# Patient Record
Sex: Male | Born: 1980 | Race: White | Hispanic: No | Marital: Single | Smoking: Never smoker
Health system: Southern US, Community
[De-identification: ages and names within clinical notes are randomized; demographics above are authoritative.]

## PROBLEM LIST (undated history)

## (undated) HISTORY — PX: DG SHOULDER ARTHROGRAM LEFT: HXRAD1607

---

## 2007-06-16 ENCOUNTER — Emergency Department (HOSPITAL_COMMUNITY): Admission: EM | Admit: 2007-06-16 | Discharge: 2007-06-16 | Payer: Self-pay | Admitting: Emergency Medicine

## 2009-01-13 IMAGING — CR DG HAND COMPLETE 3+V*R*
3 series · 3 of 3 positions shown · non-contrast
Comparison: None.

CLINICAL DATA: Injured right hand while punching a door.

RIGHT HAND - 3 VIEW  06/16/2007:

[x hand ap right]
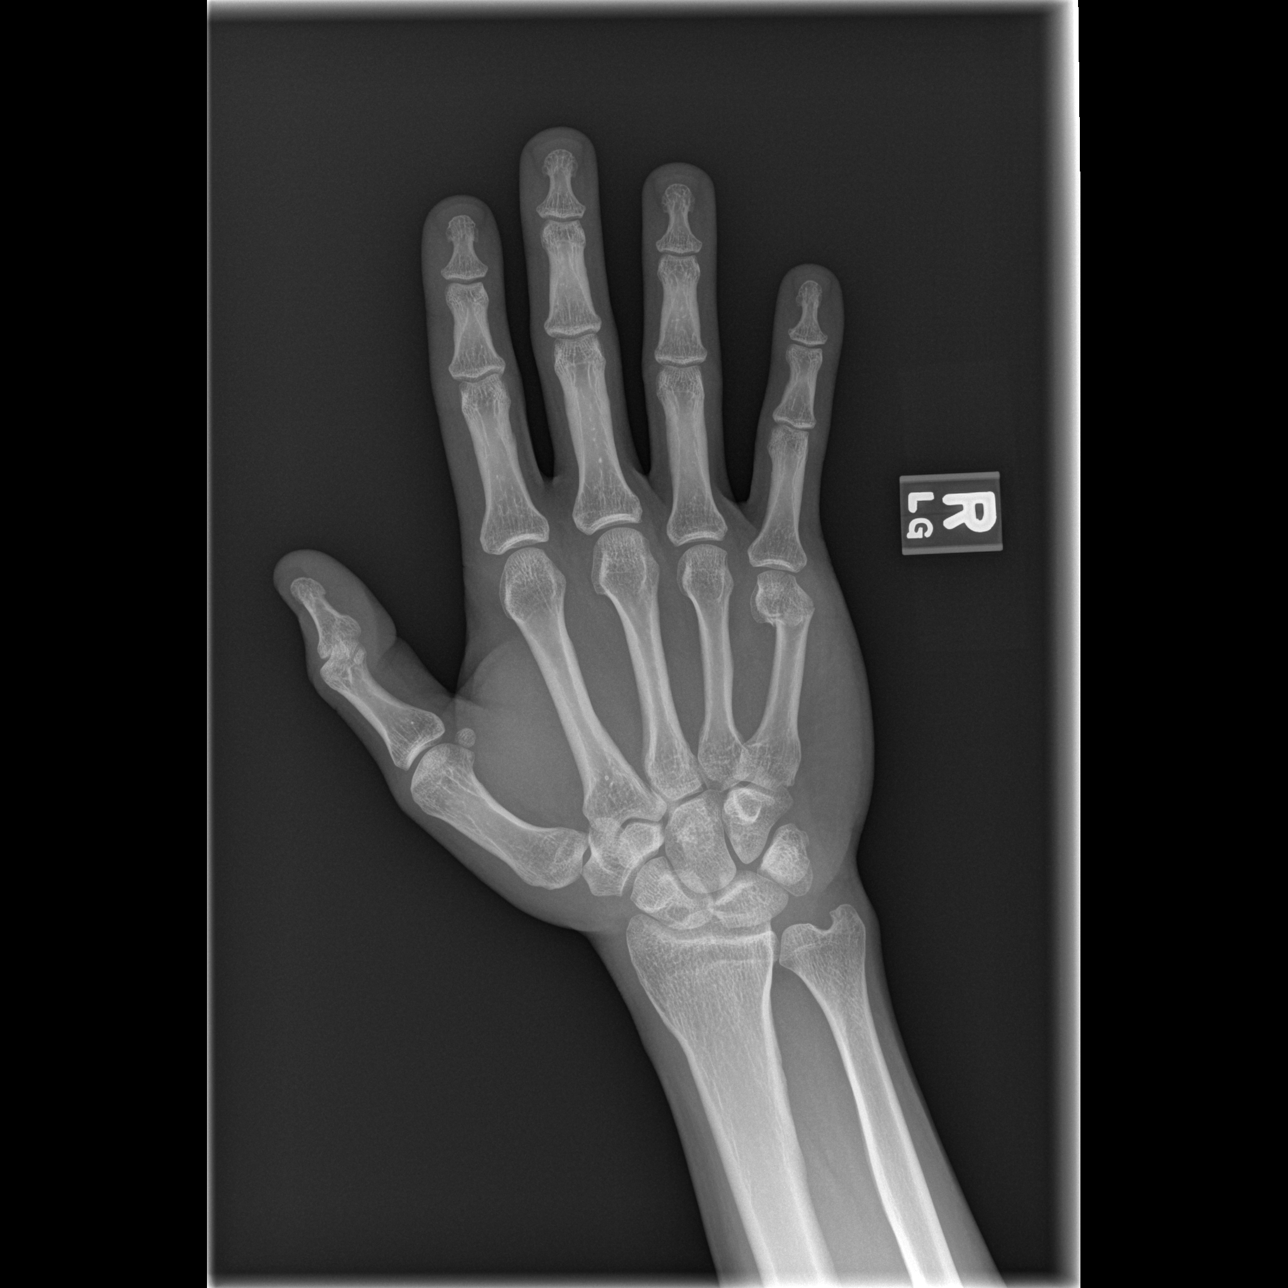

[x hand oblique right]
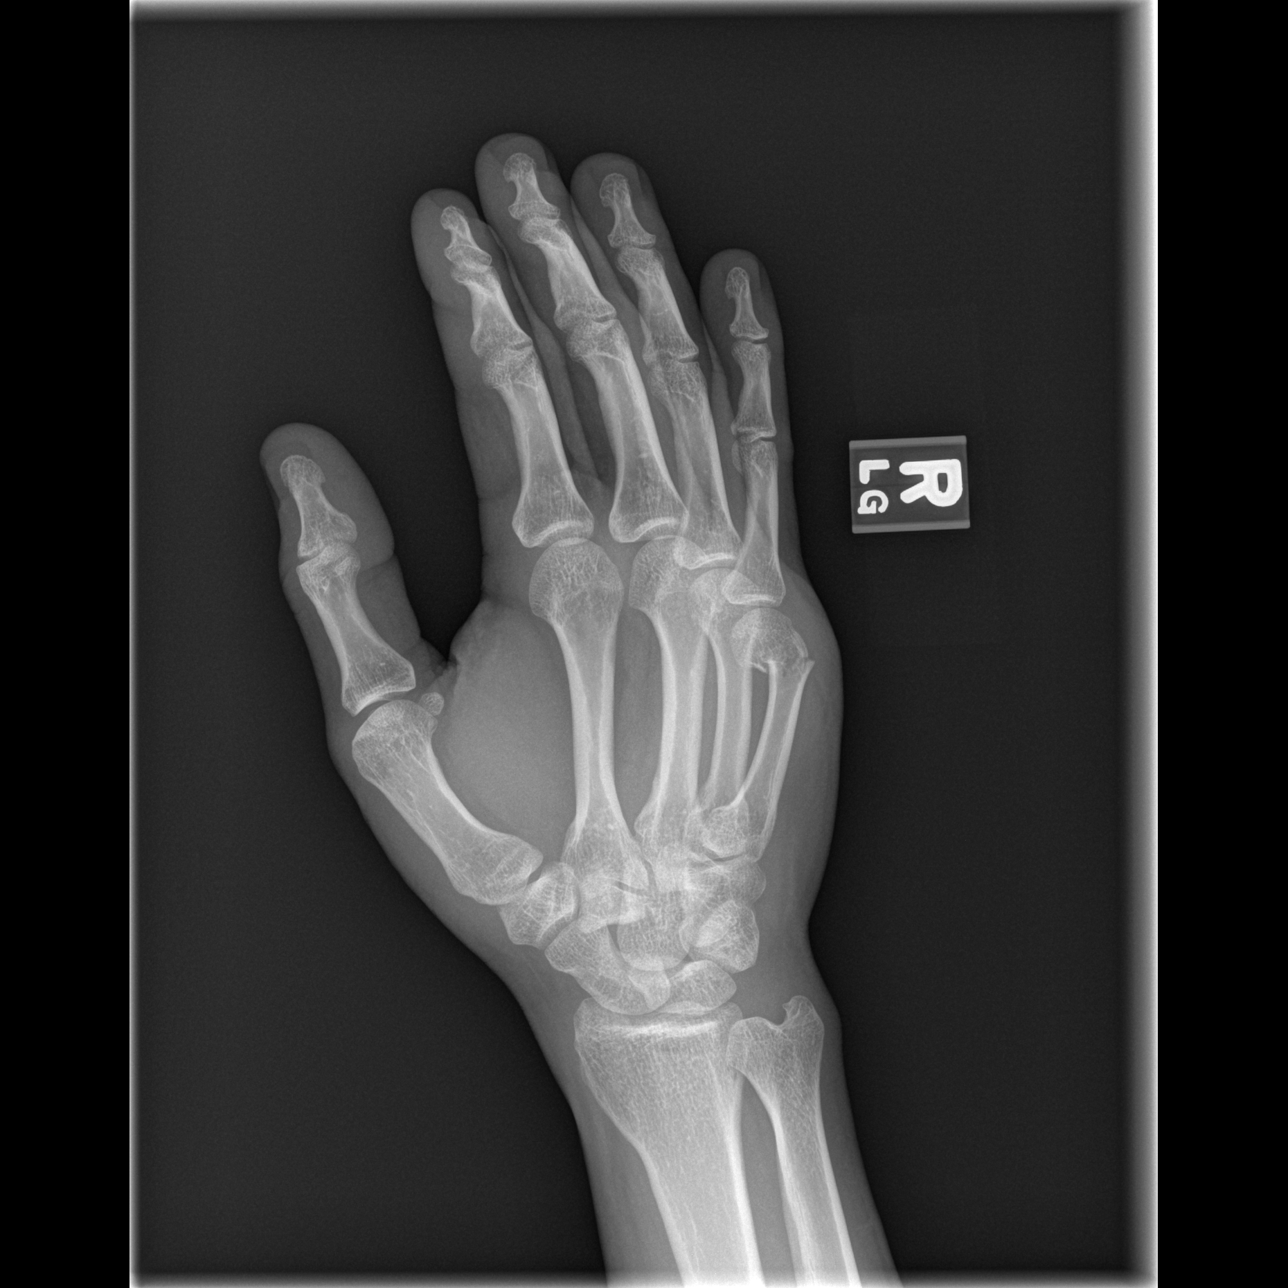

[x hand lat right]
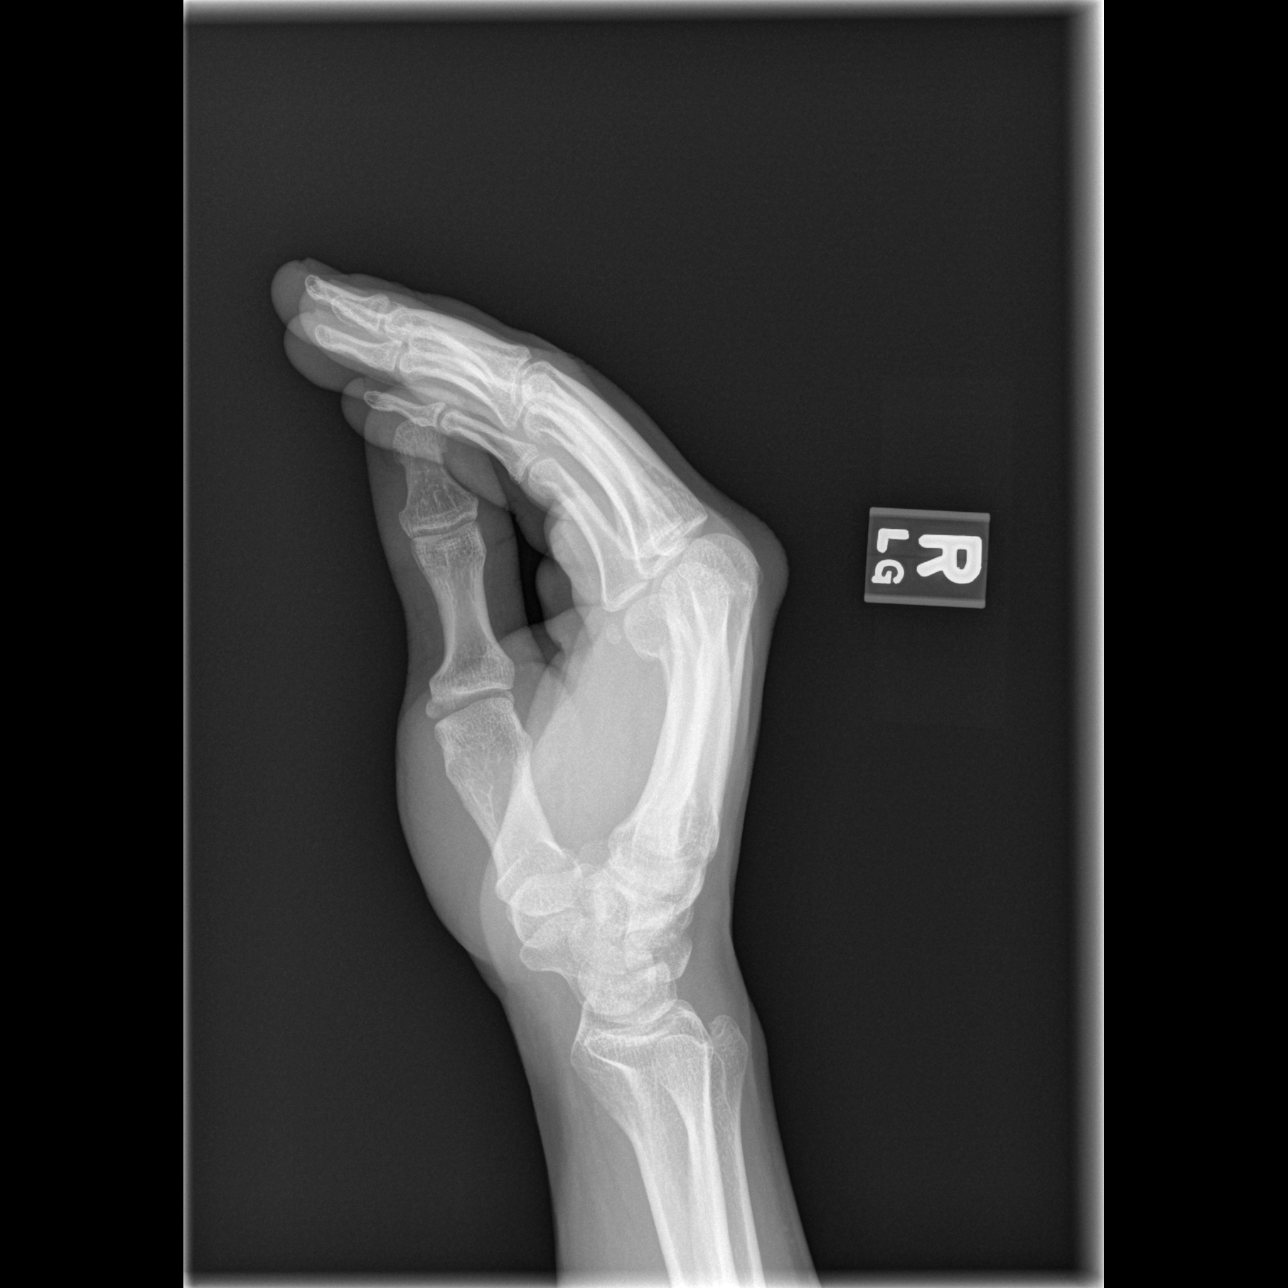

[3 of 3 positions shown; findings below may reference images not displayed]

FINDINGS: Acute comminuted fracture through the distal aspect of the fifth
metacarpal with volar angulation of the distal fragment. No other fractures.
Small cyst in the scaphoid. No other intrinsic osseous abnormalities.
Well-preserved joint spaces.
IMPRESSION: Acute comminuted fracture through the distal aspect of the fifth metacarpal with
volar angulation (Boxer's fracture).

## 2019-08-12 ENCOUNTER — Emergency Department
Admission: EM | Admit: 2019-08-12 | Discharge: 2019-08-12 | Disposition: A | Discharge status: Home / Self Care | Payer: Worker's Compensation | Attending: Emergency Medicine | Admitting: Emergency Medicine

## 2019-08-12 ENCOUNTER — Other Ambulatory Visit: Payer: Self-pay

## 2019-08-12 ENCOUNTER — Encounter: Payer: Self-pay | Admitting: Emergency Medicine

## 2019-08-12 DIAGNOSIS — W268XXA Contact with other sharp object(s), not elsewhere classified, initial encounter: Secondary | ICD-10-CM | POA: Diagnosis not present

## 2019-08-12 DIAGNOSIS — Y990 Civilian activity done for income or pay: Secondary | ICD-10-CM | POA: Insufficient documentation

## 2019-08-12 DIAGNOSIS — Z23 Encounter for immunization: Secondary | ICD-10-CM | POA: Insufficient documentation

## 2019-08-12 DIAGNOSIS — Y9289 Other specified places as the place of occurrence of the external cause: Secondary | ICD-10-CM | POA: Insufficient documentation

## 2019-08-12 DIAGNOSIS — Y9389 Activity, other specified: Secondary | ICD-10-CM | POA: Diagnosis not present

## 2019-08-12 DIAGNOSIS — S0990XA Unspecified injury of head, initial encounter: Secondary | ICD-10-CM | POA: Diagnosis present

## 2019-08-12 DIAGNOSIS — S0181XA Laceration without foreign body of other part of head, initial encounter: Secondary | ICD-10-CM | POA: Diagnosis not present

## 2019-08-12 MED ORDER — LIDOCAINE HCL (PF) 1 % IJ SOLN
INTRAMUSCULAR | Status: AC
  Administered 2019-08-12: 05:00:00 5 mL
  Filled 2019-08-12: qty 5

## 2019-08-12 MED ORDER — LIDOCAINE HCL (PF) 1 % IJ SOLN: 5.0000 mL | Freq: Once | INTRAMUSCULAR | Status: AC

## 2019-08-12 MED ORDER — TETANUS-DIPHTH-ACELL PERTUSSIS 5-2.5-18.5 LF-MCG/0.5 IM SUSP
0.5000 mL | Freq: Once | INTRAMUSCULAR | Status: AC
  Administered 2019-08-12: 05:00:00 0.5 mL via INTRAMUSCULAR
  Filled 2019-08-12: qty 0.5

## 2019-08-12 NOTE — ED Notes (Signed)
First Nurse Note - patient's supervisor, Edison Nasuti, reports patient is a temp with [employer] working at [employer].  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

## 2019-08-12 NOTE — ED Triage Notes (Signed)
Patient ambulatory to triage with complaints of left forehead laceration happened at 0300.  Pt reports while working at [employer] pt stood up and a metal claw cut the skin above the left eye.  Bleeding controlled and bandaged.    Pt A&O x 4, denies LOC  Last tetanus shot about 12 years ago

## 2019-08-12 NOTE — ED Provider Notes (Signed)
[facility] Emergency Department Provider Note  ____________________________________________   First MD Initiated Contact with Patient 08/12/19 [phone]     (approximate)  I have reviewed the triage vital signs and the nursing notes.   HISTORY  Chief Complaint Facial Laceration    HPI Victor Cordova is a 39 y.o. male presents to the emergency department secondary to left forehead laceration which patient sustained while at work tonight.  Patient states that while working he stood up in a metal clock cut just above his left eye.  Patient states last tetanus shot was approximately 12 years ago.       History reviewed. No pertinent past medical history.  There are no problems to display for this patient.   Past Surgical History:  Procedure Laterality Date  . DG SHOULDER ARTHROGRAM LEFT      Prior to Admission medications   Not on File    Allergies Patient has no known allergies.  History reviewed. No pertinent family history.  Social History Social History   Tobacco Use  . Smoking status: Never Smoker  . Smokeless tobacco: Never Used  Substance Use Topics  . Alcohol use: Never  . Drug use: Never    Review of Systems Constitutional: No fever/chills Eyes: No visual changes. ENT: No sore throat. Cardiovascular: Denies chest pain. Respiratory: Denies shortness of breath. Gastrointestinal: No abdominal pain.  No nausea, no vomiting.  No diarrhea.  No constipation. Genitourinary: Negative for dysuria. Musculoskeletal: Negative for neck pain.  Negative for back pain. Integumentary: Positive for left forehead laceration Neurological: Negative for headaches, focal weakness or numbness.   ____________________________________________   PHYSICAL EXAM:  VITAL SIGNS: ED Triage Vitals  Enc Vitals Group     BP 08/12/19 0401 134/68     Pulse Rate 08/12/19 0401 68     Resp 08/12/19 0401 18     Temp 08/12/19 0401 98.2 F (36.8 C)     Temp  Source 08/12/19 0401 Oral     SpO2 08/12/19 0401 99 %     Weight 08/12/19 0356 88.5 kg (195 lb)     Height 08/12/19 0356 1.829 m (6')     Head Circumference --      Peak Flow --      Pain Score --      Pain Loc --      Pain Edu? --      Excl. in GC? --     Constitutional: Alert and oriented.  Eyes: Conjunctivae are normal.  Head: 9 cm linear laceration above the left eyebrow on the forehead.  Bleeding controlled. Mouth/Throat: Patient is wearing a mask. Neck: No stridor.  No meningeal signs.   Musculoskeletal: No lower extremity tenderness nor edema. No gross deformities of extremities. Neurologic:  Normal speech and language. No gross focal neurologic deficits are appreciated.  Skin:  Skin is warm, dry and intact. Psychiatric: Mood and affect are normal. Speech and behavior are normal.     PROCEDURES    .Marland KitchenLaceration Repair  Date/Time: 08/12/2019 5:22 AM Performed by: Darci Current, MD Authorized by: Darci Current, MD   Consent:    Consent obtained:  Verbal   Consent given by:  Patient   Risks discussed:  Infection, pain, retained foreign body, poor cosmetic result and poor wound healing Anesthesia (see MAR for exact dosages):    Anesthesia method:  Local infiltration   Local anesthetic:  Lidocaine 1% w/o epi Laceration details:    Location:  Face  Face location:  Forehead   Length (cm):  9 Repair type:    Repair type:  Simple Exploration:    Hemostasis achieved with:  Direct pressure   Wound exploration: entire depth of wound probed and visualized     Contaminated: no   Treatment:    Area cleansed with:  Saline   Amount of cleaning:  Extensive   Irrigation solution:  Sterile saline   Visualized foreign bodies/material removed: no   Skin repair:    Repair method:  Sutures   Suture size:  6-0   Number of sutures:  10 Approximation:    Approximation:  Close Post-procedure details:    Dressing:  Sterile dressing and antibiotic ointment   Patient  tolerance of procedure:  Tolerated well, no immediate complications     ____________________________________________   INITIAL IMPRESSION / MDM / ASSESSMENT AND PLAN / ED COURSE  As part of my medical decision making, I reviewed the following data within the electronic MEDICAL RECORD NUMBER   39 year old male presented with above-stated history and physical exam secondary to laceration which was repaired without difficulty.  Patient also received Tdap.  Patient advised to have sutures removed in 7 days ____________________________________________  FINAL CLINICAL IMPRESSION(S) / ED DIAGNOSES  Final diagnoses:  Facial laceration, initial encounter     MEDICATIONS GIVEN DURING THIS VISIT:  Medications  lidocaine (PF) (XYLOCAINE) 1 % injection 5 mL (5 mLs Infiltration Given 08/12/19 0439)     ED Discharge Orders    None      *Please note:  Dorion Petillo was evaluated in Emergency Department on 08/12/2019 for the symptoms described in the history of present illness. He was evaluated in the context of the global COVID-19 pandemic, which necessitated consideration that the patient might be at risk for infection with the SARS-CoV-2 virus that causes COVID-19. Institutional protocols and algorithms that pertain to the evaluation of patients at risk for COVID-19 are in a state of rapid change based on information released by regulatory bodies including the CDC and federal and state organizations. These policies and algorithms were followed during the patient's care in the ED.  Some ED evaluations and interventions may be delayed as a result of limited staffing during the pandemic.*  Note:  This document was prepared using Dragon voice recognition software and may include unintentional dictation errors.   Gregor Hams, MD 08/12/19 [phone]

## 2019-08-12 NOTE — ED Notes (Signed)
See triage note. 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 He stood up in the path of one of the arm and it impacted the left side of his forehead above his eye brow. Pt fell but denies hitting his head or any LoC. Upon assessment, approx 3cm laceration evident above left eyebrow. Bleeding controlled.

## 2020-06-23 ENCOUNTER — Other Ambulatory Visit: Payer: Self-pay

## 2020-06-23 ENCOUNTER — Encounter (HOSPITAL_COMMUNITY): Payer: Self-pay | Admitting: Emergency Medicine

## 2020-06-23 ENCOUNTER — Ambulatory Visit (HOSPITAL_COMMUNITY)
Admission: EM | Admit: 2020-06-23 | Discharge: 2020-06-23 | Disposition: A | Discharge status: Home / Self Care | Payer: Self-pay | Attending: Family Medicine | Admitting: Family Medicine

## 2020-06-23 DIAGNOSIS — S39012A Strain of muscle, fascia and tendon of lower back, initial encounter: Secondary | ICD-10-CM

## 2020-06-23 MED ORDER — TIZANIDINE HCL 4 MG PO TABS: 4.0000 mg | ORAL_TABLET | Freq: Four times a day (QID) | ORAL | 0 refills | Status: AC | PRN

## 2020-06-23 MED ORDER — IBUPROFEN 800 MG PO TABS: 800.0000 mg | ORAL_TABLET | Freq: Three times a day (TID) | ORAL | 0 refills | Status: AC
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
## 2020-06-23 NOTE — Discharge Instructions (Addendum)
Ice or heat to area Activity as tolerated Take the ibuprofen 3 times a day with food Take the tizanidine at bedtime.  Take during day as needed Gently stretching See ortho or sport medicine if you fail to improve with time

## 2020-06-27 ENCOUNTER — Other Ambulatory Visit: Payer: Self-pay

## 2020-06-27 ENCOUNTER — Ambulatory Visit (INDEPENDENT_AMBULATORY_CARE_PROVIDER_SITE_OTHER): Payer: Self-pay | Admitting: Family Medicine

## 2020-06-27 ENCOUNTER — Ambulatory Visit (HOSPITAL_BASED_OUTPATIENT_CLINIC_OR_DEPARTMENT_OTHER)
Admission: RE | Admit: 2020-06-27 | Discharge: 2020-06-27 | Disposition: A | Discharge status: Home / Self Care | Payer: Self-pay | Source: Ambulatory Visit | Attending: Family Medicine | Admitting: Family Medicine

## 2020-06-27 DIAGNOSIS — M545 Low back pain, unspecified: Secondary | ICD-10-CM | POA: Insufficient documentation

## 2020-06-27 MED ORDER — PREDNISONE 5 MG PO TABS: ORAL_TABLET | ORAL | 0 refills | Status: AC
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
## 2020-06-27 NOTE — Patient Instructions (Signed)
Nice to meet you Please try heat  Please try the exercises  Please try the prednisone. Do not use the ibuprofen with the prednisone.  I will call with the xray results.   Please send me a message in MyChart with any questions or updates.  Please see me back in 3 weeks.   --Dr. Jordan Likes
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
## 2020-06-30 ENCOUNTER — Telehealth: Payer: Self-pay | Admitting: Family Medicine

## 2020-06-30 NOTE — Telephone Encounter (Signed)
Patient called to check status of Xray report--forwarding message to provider.  --glh

## 2020-06-30 NOTE — Telephone Encounter (Signed)
Informed of results.   Myra Rude, MD [facility] 06/30/2020, 1:17 PM

## 2020-07-18 ENCOUNTER — Ambulatory Visit: Payer: Self-pay | Admitting: Family Medicine

## 2022-10-06 ENCOUNTER — Encounter: Payer: Self-pay | Admitting: *Deleted
